# Patient Record
Sex: Male | Born: 1980 | Race: White | Hispanic: No | Marital: Married | Smoking: Former smoker
Health system: Southern US, Community
[De-identification: ages and names within clinical notes are randomized; demographics above are authoritative.]

## PROBLEM LIST (undated history)

## (undated) DIAGNOSIS — G500 Trigeminal neuralgia: Secondary | ICD-10-CM

## (undated) DIAGNOSIS — I10 Essential (primary) hypertension: Secondary | ICD-10-CM

## (undated) HISTORY — PX: TONSILLECTOMY: SUR1361

## (undated) HISTORY — PX: CRANIOTOMY: SHX93

---

## 2008-11-27 ENCOUNTER — Emergency Department (HOSPITAL_BASED_OUTPATIENT_CLINIC_OR_DEPARTMENT_OTHER): Admission: EM | Admit: 2008-11-27 | Discharge: 2008-11-27 | Payer: Self-pay | Admitting: Emergency Medicine

## 2010-04-07 LAB — BASIC METABOLIC PANEL
BUN: 11 mg/dL (ref 6–23)
CO2: 23 mEq/L (ref 19–32)
Calcium: 9.7 mg/dL (ref 8.4–10.5)
Chloride: 105 mEq/L (ref 96–112)
GFR calc non Af Amer: >60 mL/min (ref >60)
Glucose, Bld: 79 mg/dL (ref 70–99)

## 2010-04-07 LAB — DIFFERENTIAL
Basophils Absolute: 0.4 10*3/uL — ABNORMAL HIGH (ref 0.0–0.1)
Eosinophils Relative: 0 % (ref 0–5)
Lymphocytes Relative: 9 % — ABNORMAL LOW (ref 12–46)
Lymphs Abs: 1.6 10*3/uL (ref 0.7–4.0)
Monocytes Absolute: 0.8 10*3/uL (ref 0.1–1.0)
Neutro Abs: 13.9 10*3/uL — ABNORMAL HIGH (ref 1.7–7.7)

## 2010-04-07 LAB — URINALYSIS, ROUTINE W REFLEX MICROSCOPIC
Bilirubin Urine: NEGATIVE
Glucose, UA: NEGATIVE mg/dL
Ketones, ur: NEGATIVE mg/dL
Nitrite: NEGATIVE
Specific Gravity, Urine: 1.015 (ref 1.005–1.030)
pH: 6 (ref 5.0–8.0)

## 2010-04-07 LAB — CBC
HCT: 47.7 % (ref 39.0–52.0)
Hemoglobin: 16.3 g/dL (ref 13.0–17.0)
RBC: 5.49 MIL/uL (ref 4.22–5.81)
WBC: 16.7 10*3/uL — ABNORMAL HIGH (ref 4.0–10.5)

## 2014-10-30 ENCOUNTER — Other Ambulatory Visit: Payer: Self-pay | Admitting: Family Medicine

## 2014-10-30 ENCOUNTER — Ambulatory Visit
Admission: RE | Admit: 2014-10-30 | Discharge: 2014-10-30 | Disposition: A | Discharge status: Home / Self Care | Payer: 59 | Source: Ambulatory Visit | Attending: Family Medicine | Admitting: Family Medicine

## 2014-10-30 DIAGNOSIS — R05 Cough: Secondary | ICD-10-CM | POA: Diagnosis present

## 2014-10-30 DIAGNOSIS — R918 Other nonspecific abnormal finding of lung field: Secondary | ICD-10-CM | POA: Insufficient documentation

## 2014-10-30 DIAGNOSIS — R059 Cough, unspecified: Secondary | ICD-10-CM

## 2016-06-17 ENCOUNTER — Ambulatory Visit
Admission: EM | Admit: 2016-06-17 | Discharge: 2016-06-17 | Disposition: A | Discharge status: Home / Self Care | Payer: 59 | Attending: Emergency Medicine | Admitting: Emergency Medicine

## 2016-06-17 ENCOUNTER — Encounter: Payer: Self-pay | Admitting: Emergency Medicine

## 2016-06-17 DIAGNOSIS — H65191 Other acute nonsuppurative otitis media, right ear: Secondary | ICD-10-CM

## 2016-06-17 MED ORDER — BENZONATATE 200 MG PO CAPS: ORAL_CAPSULE | ORAL | 0 refills | Status: DC

## 2016-06-17 MED ORDER — AMOXICILLIN-POT CLAVULANATE 875-125 MG PO TABS: 1.0000 | ORAL_TABLET | Freq: Two times a day (BID) | ORAL | 0 refills | Status: DC

## 2016-06-17 NOTE — ED Provider Notes (Signed)
CSN: [id number]     Arrival date & time 06/17/16  1203 History   First MD Initiated Contact with Patient 06/17/16 1338     Chief Complaint  Patient presents with  . Sinus Problem   (Consider location/radiation/quality/duration/timing/severity/associated sxs/prior Treatment) HPI  36 year old male who presents with sinus congestion and pressure along with a cough that he had since Monday. [place] and upon return HIS FAMILY BECAME SICK 1 AND A TIME ON A DAILY BASIS. HE STATES THEY HAVE ALL IMPROVED BUT HE HAS REMAINED SICK. He has had some pressure in his ears. Is there any fever or chills. He has felt feverish however.       History reviewed. No pertinent past medical history. Past Surgical History:  Procedure Laterality Date  . CRANIOTOMY    . TONSILLECTOMY     Family History  Problem Relation Age of Onset  . Hypertension Mother   . Kidney Stones Mother   . CAD Father   . COPD Father    Social History  Substance Use Topics  . Smoking status: Former Games developermoker  . Smokeless tobacco: Current User  . Alcohol use No    Review of Systems  Constitutional: Positive for activity change. Negative for chills, fatigue and fever.  HENT: Positive for congestion, ear pain, postnasal drip, rhinorrhea, sinus pain and sinus pressure.   Respiratory: Positive for cough and shortness of breath. Negative for wheezing and stridor.   All other systems reviewed and are negative.   Allergies  Sudafed [pseudoephedrine]  Home Medications   Prior to Admission medications   Medication Sig Start Date End Date Taking? Authorizing Provider  amoxicillin-clavulanate (AUGMENTIN) 875-125 MG tablet Take 1 tablet by mouth every 12 (twelve) hours. 06/17/16   Lutricia Feiloemer, William P, PA-C  benzonatate (TESSALON) 200 MG capsule Take one cap TID PRN cough 06/17/16   Lutricia Feiloemer, William P, PA-C   Meds Ordered and Administered this Visit  Medications - No data to display  BP 125/80 (BP  Location: Left Arm)   Pulse 84   Temp 98 F (36.7 C) (Oral)   Resp 16   Ht 6\' 1"  (1.854 m)   Wt 248 lb (112.5 kg)   SpO2 100%   BMI 32.72 kg/m  No data found.   Physical Exam  Constitutional: He is oriented to person, place, and time. He appears well-developed and well-nourished. No distress.  HENT:  Head: Normocephalic and atraumatic.  Left Ear: External ear normal.  Nose: Nose normal.  Mouth/Throat: Oropharynx is clear and moist. No oropharyngeal exudate.  Examination of the right ear shows the right TM to be erythematous loss of the light reflex and a mildly bulging.  Eyes: Pupils are equal, round, and reactive to light. Right eye exhibits no discharge. Left eye exhibits no discharge.  Neck: Normal range of motion.  Pulmonary/Chest: Effort normal and breath sounds normal. No respiratory distress. He has no wheezes. He has no rales.  Musculoskeletal: Normal range of motion.  Lymphadenopathy:    He has no cervical adenopathy.  Neurological: He is alert and oriented to person, place, and time.  Skin: Skin is warm and dry. He is not diaphoretic.  Nursing note and vitals reviewed.   Urgent Care Course     Procedures (including critical care time)  Labs Review Labs Reviewed - No data to display  Imaging Review No results found.   Visual Acuity Review  Right Eye Distance:   Left Eye Distance:   Bilateral Distance:  Right Eye Near:   Left Eye Near:    Bilateral Near:         MDM   1. Other acute nonsuppurative otitis media of right ear, recurrence not specified    Discharge Medication List as of 06/17/2016  1:53 PM    START taking these medications   Details  amoxicillin-clavulanate (AUGMENTIN) 875-125 MG tablet Take 1 tablet by mouth every 12 (twelve) hours., Starting Fri 06/17/2016, Normal    benzonatate (TESSALON) 200 MG capsule Take one cap TID PRN cough, Normal      Plan: 1. Test/x-ray results and diagnosis reviewed with patient 2. rx as per  orders; risks, benefits, potential side effects reviewed with patient 3. Recommend supportive treatment with Use of Motrin for pain or feverish feeling. If not improving within 3-4 days should follow-up with primary care physician. 4. F/u prn if symptoms worsen or don't improve     Lutricia Feil, PA-C 06/17/16 1502

## 2016-06-17 NOTE — ED Triage Notes (Signed)
Patient c/o sinus congestion and pressure and cough since Tuesday.

## 2016-12-25 ENCOUNTER — Other Ambulatory Visit: Payer: Self-pay

## 2016-12-25 ENCOUNTER — Ambulatory Visit
Admission: EM | Admit: 2016-12-25 | Discharge: 2016-12-25 | Disposition: A | Discharge status: Home / Self Care | Payer: 59 | Attending: Family Medicine | Admitting: Family Medicine

## 2016-12-25 ENCOUNTER — Encounter: Payer: Self-pay | Admitting: Emergency Medicine

## 2016-12-25 DIAGNOSIS — J069 Acute upper respiratory infection, unspecified: Secondary | ICD-10-CM | POA: Diagnosis not present

## 2016-12-25 DIAGNOSIS — J029 Acute pharyngitis, unspecified: Secondary | ICD-10-CM

## 2016-12-25 NOTE — ED Provider Notes (Signed)
[facility]    CSN: [id number] Arrival date & time: 12/25/16  1542  History   Chief Complaint Chief Complaint  Patient presents with  . Nasal Congestion   HPI 36 year old male presents with ear pain, nasal congestion.  Patient reports a one-week history of congestion and ear pain, right greater than left.  He reports postnasal drip and sore throat as well.  He has been taking Sudafed without relief.  Symptoms are mild to moderate.  He has had a recent sick contact in his daughter.  No known exacerbating factors.  No other complaints at this time.  PMH -trigeminal neuralgia.  Past Surgical History:  Procedure Laterality Date  . CRANIOTOMY    . TONSILLECTOMY     Home Medications    Prior to Admission medications   Medication Sig Start Date End Date Taking? Authorizing Provider  Creatinine POWD by Does not apply route daily.   Yes [provider]  magnesium gluconate (MAGONATE) 500 MG tablet Take 500 mg by mouth daily.   Yes [provider]  Potassium 99 MG TABS Take 2 tablets by mouth daily.   Yes [provider]    Family History Family History  Problem Relation Age of Onset  . Hypertension Mother   . Kidney Stones Mother   . CAD Father   . COPD Father     Social History Social History   Tobacco Use  . Smoking status: Former Smoker    Last attempt to quit: 12/25/2008    Years since quitting: 8.0  . Smokeless tobacco: Current User  Substance Use Topics  . Alcohol use: No  . Drug use: No     Allergies   Sudafed [pseudoephedrine]   Review of Systems Review of Systems  Constitutional: Negative for chills and fever.  HENT: Positive for congestion, ear pain, postnasal drip and sore throat.    Physical Exam Triage Vital Signs ED Triage Vitals [12/25/16 1623]  Enc Vitals Group     BP 127/80     Pulse Rate 70     Resp 16     Temp 98.4 F (36.9 C)     Temp Source Oral     SpO2 99 %     Weight 250 lb (113.4 kg)     Height 6\' 1"  (1.854 m)     Head Circumference      Peak Flow      Pain Score 5     Pain Loc      Pain Edu?      Excl. in GC?    Updated Vital Signs BP 127/80 (BP Location: Left Arm)   Pulse 70   Temp 98.4 F (36.9 C) (Oral)   Resp 16   Ht 6\' 1"  (1.854 m)   Wt 250 lb (113.4 kg)   SpO2 99%   BMI 32.98 kg/m     Physical Exam  Constitutional: He is oriented to person, place, and time. He appears well-developed. No distress.  HENT:  Head: Normocephalic and atraumatic.  Oropharynx with moderate erythema.  No exudate.  Normal TMs bilaterally.  Eyes: Conjunctivae are normal.  Neck: Neck supple.  Cardiovascular: Normal rate and regular rhythm.  No murmur heard. Pulmonary/Chest: Effort normal and breath sounds normal. He has no wheezes. He has no rales.  Neurological: He is alert and oriented to person, place, and time.  Psychiatric: He has a normal mood and affect. His behavior is normal.  Vitals reviewed.  UC Treatments / Results  Labs (all labs ordered are listed, but only abnormal results are displayed) Labs Reviewed - No data to display  EKG  EKG Interpretation None       Radiology No results found.  Procedures Procedures (including critical care time)  Medications Ordered in UC Medications - No data to display   Initial Impression / Assessment and Plan / UC Course  I have reviewed the triage vital signs and the nursing notes.  Pertinent labs & imaging results that were available during my care of the patient were reviewed by me and considered in my medical decision making (see chart for details).     36 year old male presents with a viral respiratory infection.  Supportive care.  Over-the-counter Tylenol and Motrin as needed.  Final Clinical Impressions(s) / UC Diagnoses   Final diagnoses:  Viral upper respiratory tract infection   ED Discharge Orders    None     Controlled Substance Prescriptions [place] Controlled Substance Registry consulted? Not  Applicable   Tommie SamsCook, Amauria Younts G, DO 12/25/16 1810

## 2016-12-25 NOTE — ED Triage Notes (Signed)
Patient in today c/o 1 week history of nasal congestion and bilateral ear pain R>L. Patient denies fever. Patient has tried OTC Sudafed.

## 2017-02-11 ENCOUNTER — Ambulatory Visit
Admission: EM | Admit: 2017-02-11 | Discharge: 2017-02-11 | Disposition: A | Discharge status: Home / Self Care | Payer: BLUE CROSS/BLUE SHIELD | Attending: Family Medicine | Admitting: Family Medicine

## 2017-02-11 ENCOUNTER — Other Ambulatory Visit: Payer: Self-pay

## 2017-02-11 ENCOUNTER — Encounter: Payer: Self-pay | Admitting: Gynecology

## 2017-02-11 DIAGNOSIS — N451 Epididymitis: Secondary | ICD-10-CM

## 2017-02-11 DIAGNOSIS — N50819 Testicular pain, unspecified: Secondary | ICD-10-CM

## 2017-02-11 LAB — URINALYSIS, COMPLETE (UACMP) WITH MICROSCOPIC
BILIRUBIN URINE: NEGATIVE
Bacteria, UA: NONE SEEN
GLUCOSE, UA: NEGATIVE mg/dL
HGB URINE DIPSTICK: NEGATIVE
Ketones, ur: NEGATIVE mg/dL
Leukocytes, UA: NEGATIVE
NITRITE: NEGATIVE
Protein, ur: NEGATIVE mg/dL
RBC / HPF: NONE SEEN RBC/hpf (ref 0–5)
SPECIFIC GRAVITY, URINE: 1.01 (ref 1.005–1.030)
Squamous Epithelial / LPF: NONE SEEN
pH: 6.5 (ref 5.0–8.0)

## 2017-02-11 LAB — CHLAMYDIA/NGC RT PCR (ARMC ONLY)
Chlamydia Tr: NOT DETECTED
N gonorrhoeae: NOT DETECTED

## 2017-02-11 MED ORDER — HYDROCODONE-ACETAMINOPHEN 5-325 MG PO TABS: ORAL_TABLET | ORAL | 0 refills | Status: DC

## 2017-02-11 MED ORDER — DOXYCYCLINE HYCLATE 100 MG PO TABS: 100.0000 mg | ORAL_TABLET | Freq: Two times a day (BID) | ORAL | 0 refills | Status: DC

## 2017-02-11 NOTE — ED Provider Notes (Signed)
[facility]    CSN: [id number] Arrival date & time: 02/11/17  1339     History   Chief Complaint Chief Complaint  Patient presents with  . Groin Swelling    HPI Gregory Cunningham is a 37 y.o. male.   37 yo male with a c/o left testicle pain, frequent urination and brown tinged semen today during intercourse with his wife. Denies any trauma, fevers, chills, dysuria, penile discharge, skin redness, swelling.    The history is provided by the patient.    History reviewed. No pertinent past medical history.  There are no active problems to display for this patient.   Past Surgical History:  Procedure Laterality Date  . CRANIOTOMY    . TONSILLECTOMY         Home Medications    Prior to Admission medications   Medication Sig Start Date End Date Taking? Authorizing Provider  Creatinine POWD by Does not apply route daily.    [provider]  doxycycline (VIBRA-TABS) 100 MG tablet Take 1 tablet (100 mg total) by mouth 2 (two) times daily. 02/11/17   Payton Mccallum, MD  HYDROcodone-acetaminophen (NORCO/VICODIN) 5-325 MG tablet 1-2 tabs po bid prn 02/11/17   Payton Mccallum, MD  magnesium gluconate (MAGONATE) 500 MG tablet Take 500 mg by mouth daily.    [provider]  Potassium 99 MG TABS Take 2 tablets by mouth daily.    [provider]    Family History Family History  Problem Relation Age of Onset  . Hypertension Mother   . Kidney Stones Mother   . CAD Father   . COPD Father     Social History Social History   Tobacco Use  . Smoking status: Former Smoker    Last attempt to quit: 12/25/2008    Years since quitting: 8.1  . Smokeless tobacco: Current User  Substance Use Topics  . Alcohol use: No  . Drug use: No     Allergies   Sudafed [pseudoephedrine]   Review of Systems Review of Systems   Physical Exam Triage Vital Signs ED Triage Vitals  Enc Vitals Group     BP 02/11/17 1400 121/74     Pulse Rate 02/11/17  1400 71     Resp 02/11/17 1400 16     Temp 02/11/17 1400 97.8 F (36.6 C)     Temp Source 02/11/17 1400 Oral     SpO2 02/11/17 1400 100 %     Weight 02/11/17 1358 239 lb (108.4 kg)     Height --      Head Circumference --      Peak Flow --      Pain Score 02/11/17 1358 8     Pain Loc --      Pain Edu? --      Excl. in GC? --    No data found.  Updated Vital Signs BP 121/74 (BP Location: Left Arm)   Pulse 71   Temp 97.8 F (36.6 C) (Oral)   Resp 16   Wt 239 lb (108.4 kg)   SpO2 100%   BMI 31.53 kg/m   Visual Acuity Right Eye Distance:   Left Eye Distance:   Bilateral Distance:    Right Eye Near:   Left Eye Near:    Bilateral Near:     Physical Exam  Constitutional: He appears well-developed and well-nourished. No distress.  HENT:  Head: Normocephalic and atraumatic.  Abdominal: Hernia confirmed negative in the right inguinal area and confirmed  negative in the left inguinal area.  Genitourinary: Penis normal. Cremasteric reflex is present. Right testis shows no mass, no swelling and no tenderness. Right testis is descended. Cremasteric reflex is not absent on the right side. Left testis shows tenderness (over the epididymis). Left testis shows no mass and no swelling. Left testis is descended. Cremasteric reflex is not absent on the left side. No phimosis, paraphimosis, hypospadias, penile erythema or penile tenderness. No discharge found.  Lymphadenopathy: No inguinal adenopathy noted on the right or left side.  Skin: No rash noted. He is not diaphoretic.  Nursing note and vitals reviewed.    UC Treatments / Results  Labs (all labs ordered are listed, but only abnormal results are displayed) Labs Reviewed  URINALYSIS, COMPLETE (UACMP) WITH MICROSCOPIC - Abnormal; Notable for the following components:      Result Value   Color, Urine STRAW (*)    All other components within normal limits  CHLAMYDIA/NGC RT PCR [facility] ONLY)    EKG  EKG  Interpretation None       Radiology No results found.  Procedures Procedures (including critical care time)  Medications Ordered in UC Medications - No data to display   Initial Impression / Assessment and Plan / UC Course  I have reviewed the triage vital signs and the nursing notes.  Pertinent labs & imaging results that were available during my care of the patient were reviewed by me and considered in my medical decision making (see chart for details).       Final Clinical Impressions(s) / UC Diagnoses   Final diagnoses:  Epididymitis    ED Discharge Orders        Ordered    doxycycline (VIBRA-TABS) 100 MG tablet  2 times daily     02/11/17 1455    HYDROcodone-acetaminophen (NORCO/VICODIN) 5-325 MG tablet     02/11/17 1456     1. Lab results and diagnosis reviewed with patient 2. rx as per orders above; reviewed possible side effects, interactions, risks and benefits  3. Follow-up prn if symptoms worsen or don't improve  Controlled Substance Prescriptions [place] Controlled Substance Registry consulted? Not Applicable   Payton Mccallumonty, Ansleigh Safer, MD 02/11/17 [phone]

## 2017-02-11 NOTE — ED Triage Notes (Signed)
Patient c/o x yesterday left testicular pain/ frequent urination and brow discharge after ejaculation.

## 2017-05-23 IMAGING — CR DG CHEST 2V
2 series · 2 of 2 positions shown · non-contrast
Comparison: None in PACs

CLINICAL DATA: 3-4 weeks of productive cough associated with
anterior mid upper chest pain and dyspnea on exertion, history of
bronchitis

EXAM:
CHEST  2 VIEW

[chest pa]
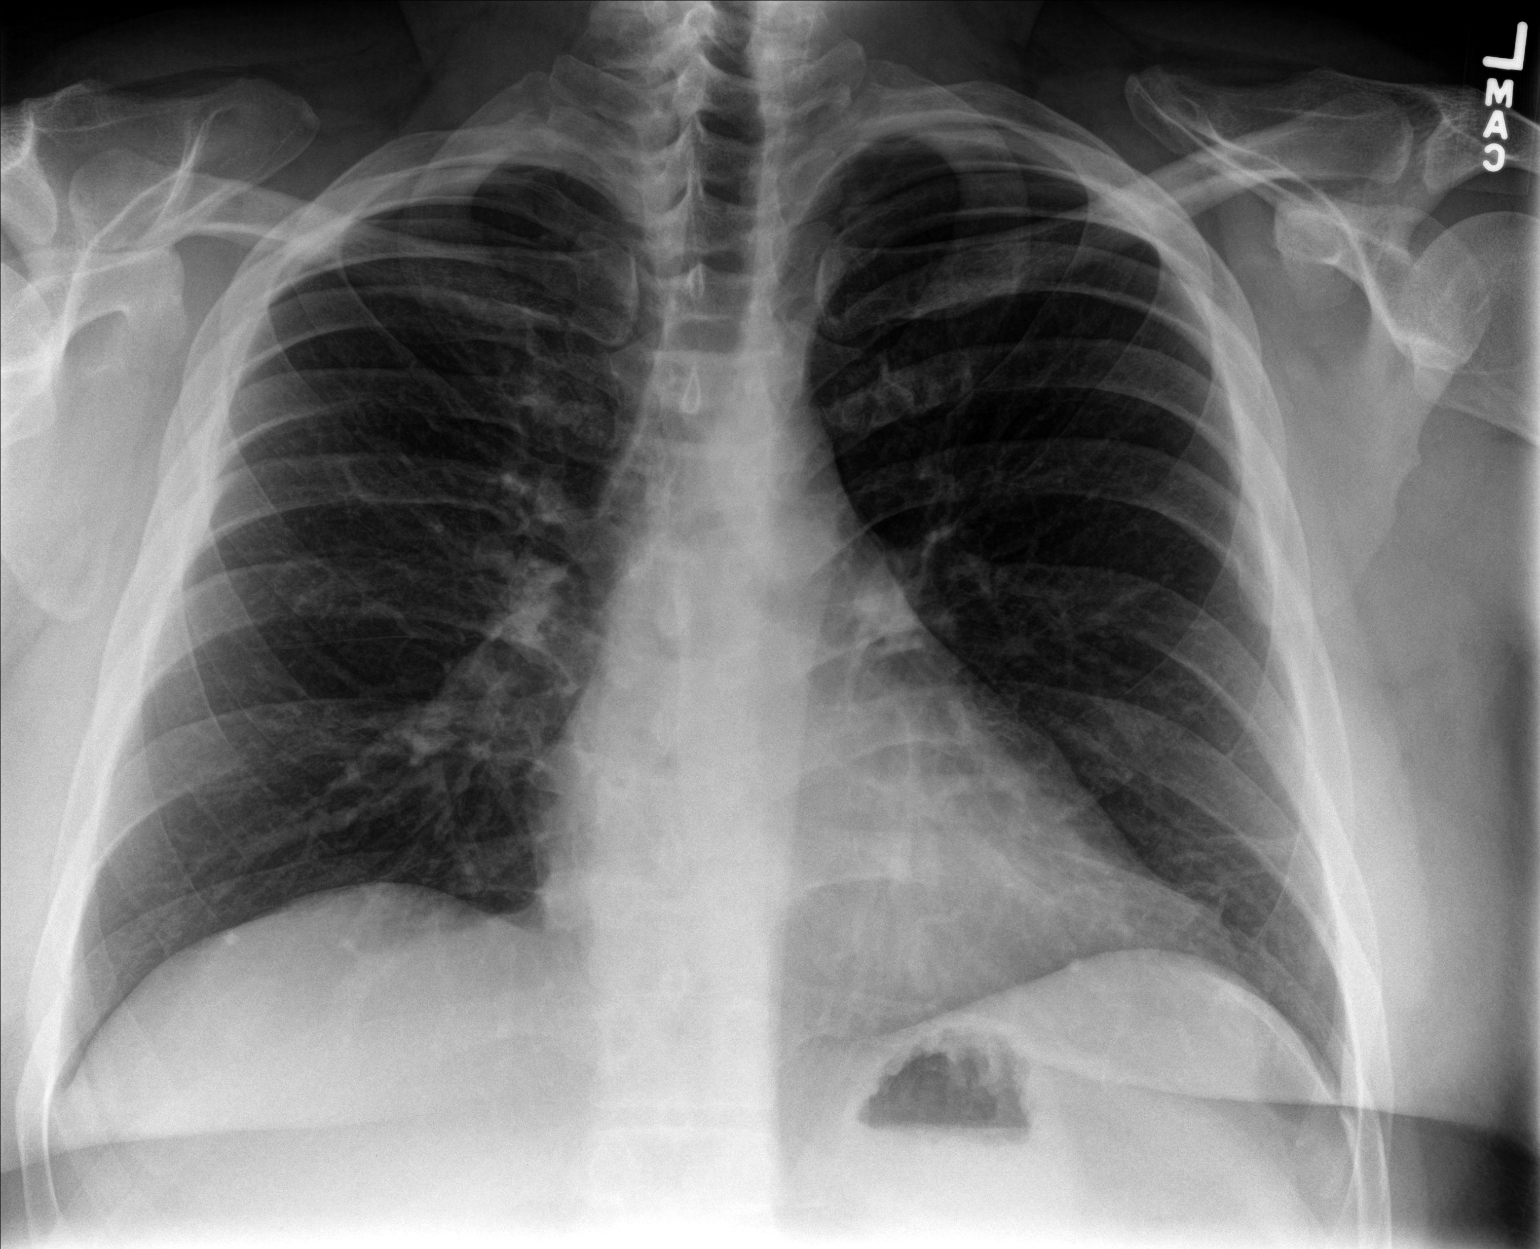

[chest lat]
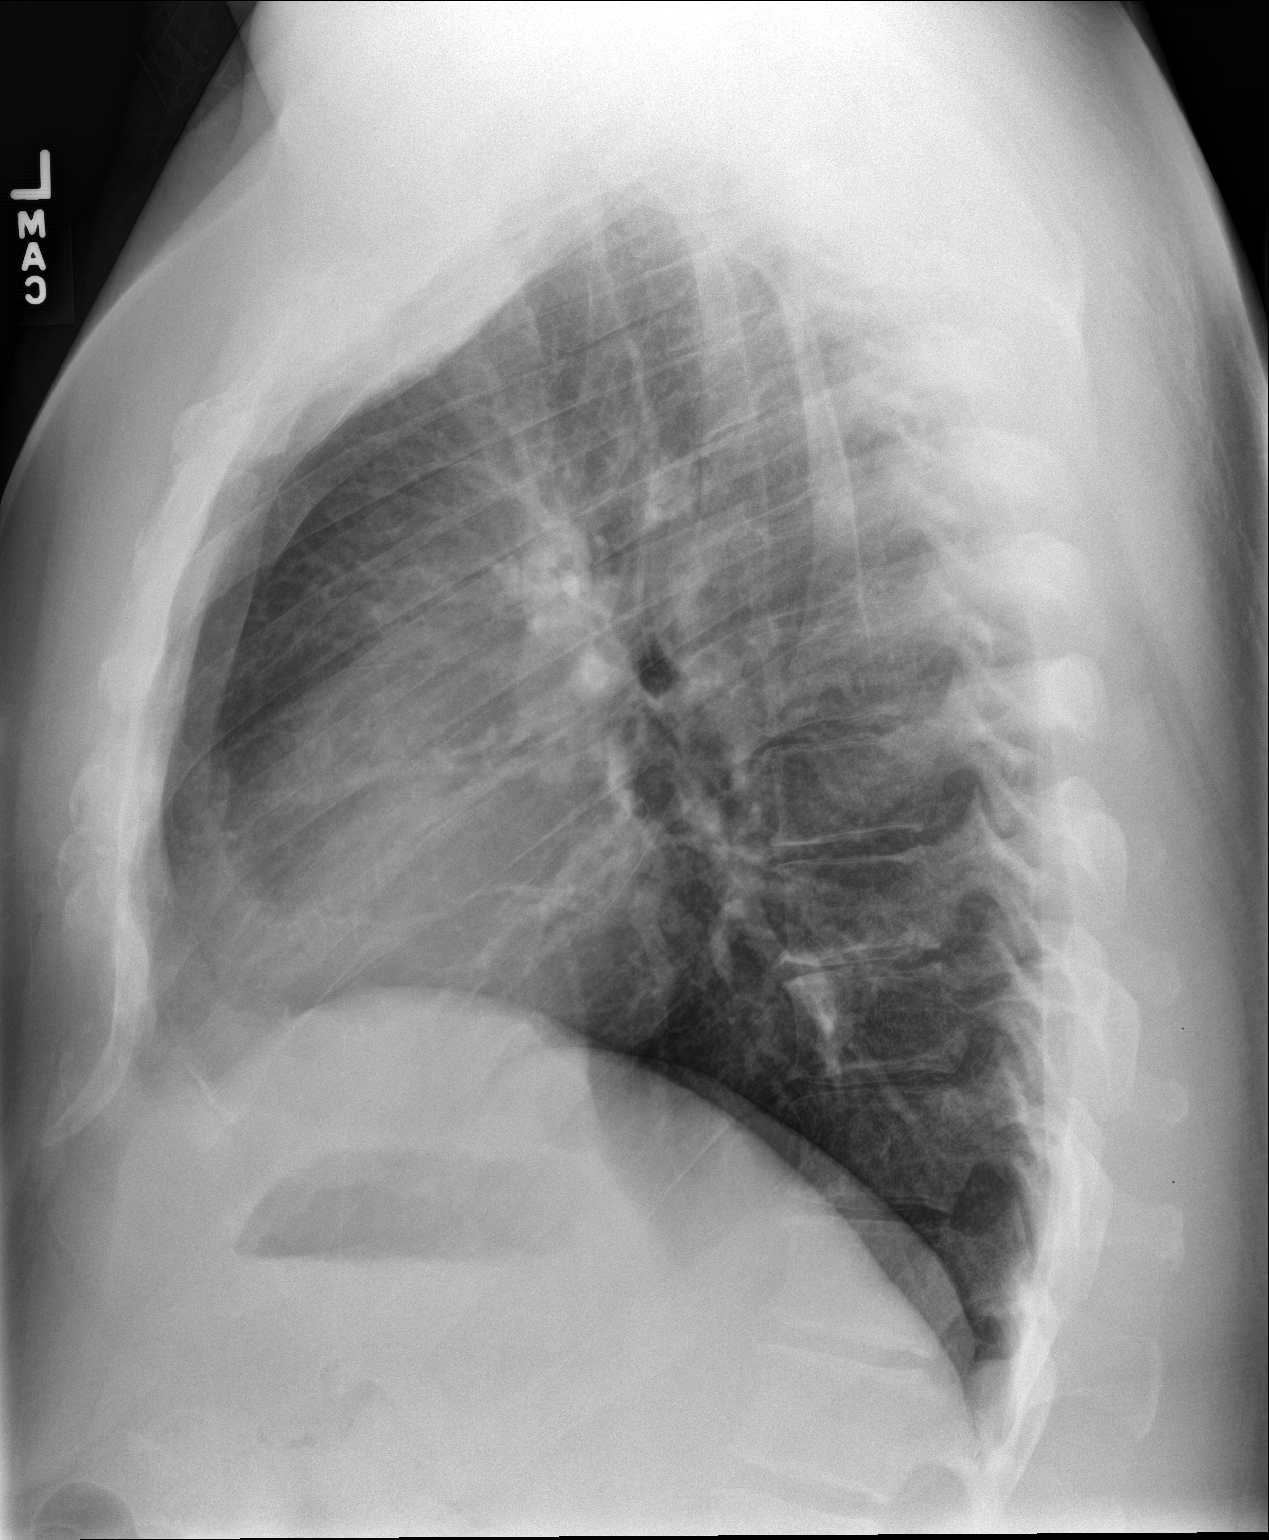

[2 of 2 positions shown; findings below may reference images not displayed]

FINDINGS: The lungs are adequately inflated. There is no focal infiltrate. The
interstitial markings are minimally prominent bilaterally. The heart
and pulmonary vascularity are normal. There is no pleural effusion.
IMPRESSION: Mild interstitial prominence may reflect acute or chronic bronchitic
changes. There is no alveolar pneumonia nor CHF. If the patient's
symptoms persist despite anticipated antibiotic therapy, chest CT
scanning may be useful.

## 2018-01-20 ENCOUNTER — Ambulatory Visit
Admission: EM | Admit: 2018-01-20 | Discharge: 2018-01-20 | Disposition: A | Discharge status: Home / Self Care | Payer: Managed Care, Other (non HMO) | Attending: Family Medicine | Admitting: Family Medicine

## 2018-01-20 ENCOUNTER — Other Ambulatory Visit: Payer: Self-pay

## 2018-01-20 ENCOUNTER — Encounter: Payer: Self-pay | Admitting: Gynecology

## 2018-01-20 DIAGNOSIS — H6501 Acute serous otitis media, right ear: Secondary | ICD-10-CM | POA: Diagnosis not present

## 2018-01-20 DIAGNOSIS — J029 Acute pharyngitis, unspecified: Secondary | ICD-10-CM

## 2018-01-20 DIAGNOSIS — R059 Cough, unspecified: Secondary | ICD-10-CM

## 2018-01-20 DIAGNOSIS — J111 Influenza due to unidentified influenza virus with other respiratory manifestations: Secondary | ICD-10-CM

## 2018-01-20 DIAGNOSIS — R0981 Nasal congestion: Secondary | ICD-10-CM

## 2018-01-20 DIAGNOSIS — R05 Cough: Secondary | ICD-10-CM | POA: Diagnosis not present

## 2018-01-20 DIAGNOSIS — H6591 Unspecified nonsuppurative otitis media, right ear: Secondary | ICD-10-CM | POA: Insufficient documentation

## 2018-01-20 DIAGNOSIS — R69 Illness, unspecified: Secondary | ICD-10-CM | POA: Insufficient documentation

## 2018-01-20 DIAGNOSIS — Z87891 Personal history of nicotine dependence: Secondary | ICD-10-CM

## 2018-01-20 LAB — RAPID STREP SCREEN (MED CTR MEBANE ONLY): Streptococcus, Group A Screen (Direct): NEGATIVE

## 2018-01-20 MED ORDER — AMOXICILLIN 875 MG PO TABS: 875.0000 mg | ORAL_TABLET | Freq: Two times a day (BID) | ORAL | 0 refills | Status: DC

## 2018-01-20 MED ORDER — HYDROCOD POLST-CPM POLST ER 10-8 MG/5ML PO SUER: 5.0000 mL | Freq: Every evening | ORAL | 0 refills | Status: DC | PRN

## 2018-01-20 NOTE — ED Triage Notes (Signed)
Patient c/o nasal drip / sore throat and fever of 101 / ear pain x 1 week

## 2018-01-20 NOTE — ED Provider Notes (Addendum)
[facility] ____________________________________________  Time seen: Approximately 11:32 AM  I have reviewed the triage vital signs and the nursing notes.   HISTORY  Chief Complaint No chief complaint on file.   HPI Gregory Cunningham is a 38 y.o. male presenting for evaluation of 4 days of runny nose, nasal congestion, sore throat with intermittent fevers.  States on Thursday he had a fever onset with sore throat and nasal congestion.  States sore throat currently is mild but previously was moderate.  States T-max 101.  Has overall continued to eat and drink well.  States does have still some chills.  Has been taken over-the-counter DayQuil, NyQuil, Tylenol, ibuprofen and Zicam which helps with symptoms.  States cough has intermittently disrupted sleep.  States that he did have a mild runny nose prior to the symptoms with some intermittent diarrhea.  And reports his kids recently sick with those similar complaints.  Continues eat and drink well.  Occasional still diarrhea.  Denies abdominal pain, chest pain, shortness of breath.  Denies other aggravating alleviating factors.  Rolm Gala, MD: PCP   History reviewed. No pertinent past medical history. Denies   There are no active problems to display for this patient.   Past Surgical History:  Procedure Laterality Date  . CRANIOTOMY    . TONSILLECTOMY       No current facility-administered medications for this encounter.   Current Outpatient Medications:  .  amoxicillin (AMOXIL) 875 MG tablet, Take 1 tablet (875 mg total) by mouth 2 (two) times daily., Disp: 20 tablet, Rfl: 0 .  chlorpheniramine-HYDROcodone (TUSSIONEX PENNKINETIC ER) 10-8 MG/5ML SUER, Take 5 mLs by mouth at bedtime as needed. do not drive or operate machinery while taking as can cause drowsiness., Disp: 50 mL, Rfl: 0  Allergies Sudafed [pseudoephedrine]  Family History  Problem Relation Age of Onset  . Hypertension Mother   . Kidney Stones  Mother   . CAD Father   . COPD Father     Social History Social History   Tobacco Use  . Smoking status: Former Smoker    Last attempt to quit: 12/25/2008    Years since quitting: 9.0  . Smokeless tobacco: Current User  Substance Use Topics  . Alcohol use: No  . Drug use: No    Review of Systems Constitutional: positive fever. ENT: as above.  Cardiovascular: Denies chest pain. Respiratory: Denies shortness of breath. Gastrointestinal: No abdominal pain.  Intermittent diarrhea.  No vomiting. Genitourinary: Negative for dysuria. Musculoskeletal: Negative for back pain. Skin: Negative for rash.  ____________________________________________   PHYSICAL EXAM:  VITAL SIGNS: ED Triage Vitals  Enc Vitals Group     BP 01/20/18 1038 120/85     Pulse Rate 01/20/18 1038 89     Resp 01/20/18 1038 16     Temp 01/20/18 1038 98.4 F (36.9 C)     Temp Source 01/20/18 1038 Oral     SpO2 01/20/18 1038 99 %     Weight 01/20/18 1040 250 lb (113.4 kg)     Height 01/20/18 1040 6\' 1"  (1.854 m)     Head Circumference --      Peak Flow --      Pain Score 01/20/18 1040 2     Pain Loc --      Pain Edu? --      Excl. in GC? --     Constitutional: Alert and oriented. Well appearing and in no acute distress. Eyes: Conjunctivae are normal.  Head: Atraumatic. No sinus  tenderness to palpation. No swelling. No erythema.  Ears: Left: Nontender, normal canal, no erythema, normal TM.  Right: Nontender, normal canal, moderate erythema with effusion present.  Nose:Nasal congestion with clear rhinorrhea  Mouth/Throat: Mucous membranes are moist. Mild pharyngeal erythema. No tonsillar swelling or exudate.  Neck: No stridor.  No cervical spine tenderness to palpation. Hematological/Lymphatic/Immunilogical: No cervical lymphadenopathy. Cardiovascular: Normal rate, regular rhythm. Grossly normal heart sounds.  Good peripheral circulation. Respiratory: Normal respiratory effort.  No retractions. No  wheezes, rales or rhonchi. Good air movement.  Musculoskeletal: Ambulatory with steady gait.  Neurologic:  Normal speech and language. No gait instability. Skin:  Skin appears warm, dry and intact. No rash noted. Psychiatric: Mood and affect are normal. Speech and behavior are normal. ___________________________________________   LABS (all labs ordered are listed, but only abnormal results are displayed)  Labs Reviewed  RAPID STREP SCREEN ([facility] ONLY)  CULTURE, GROUP A STREP [facility])   ____________________________________________  PROCEDURES Procedures   INITIAL IMPRESSION / ASSESSMENT AND PLAN / ED COURSE  Pertinent labs & imaging results that were available during my care of the patient were reviewed by me and considered in my medical decision making (see chart for details).  Overall well-appearing patient.  No acute distress.  Suspect recent viral illness such as influenza.  Right otitis with effusion.  Will treat with oral amoxicillin.  Encourage continue over-the-counter supportive care for viral symptoms including over-the-counter cough and congestion medication.  Rx for PRN Tussionex given as needed at nighttime.  Work note given.Discussed indication, risks and benefits of medications with patient.  Discussed follow up with Primary care physician this week. Discussed follow up and return parameters including no resolution or any worsening concerns. Patient verbalized understanding and agreed to plan.   ____________________________________________   FINAL CLINICAL IMPRESSION(S) / ED DIAGNOSES  Final diagnoses:  Influenza-like illness  Right otitis media with effusion  Cough     ED Discharge Orders         Ordered    chlorpheniramine-HYDROcodone (TUSSIONEX PENNKINETIC ER) 10-8 MG/5ML SUER  At bedtime PRN     01/20/18 1127    amoxicillin (AMOXIL) 875 MG tablet  2 times daily     01/20/18 1127           Note: This dictation was prepared with Dragon  dictation along with smaller phrase technology. Any transcriptional errors that result from this process are unintentional.         Renford Dills, NP 01/20/18 1139    Renford Dills, NP 01/20/18 1149

## 2018-01-20 NOTE — Discharge Instructions (Signed)
Take medication as prescribed. Rest. Drink plenty of fluids. Continue over the  counter medication.  ° °Follow up with your primary care physician this week as needed. Return to Urgent care for new or worsening concerns.  ° °

## 2018-01-23 LAB — CULTURE, GROUP A STREP (THRC)

## 2019-12-23 ENCOUNTER — Ambulatory Visit: Payer: Managed Care, Other (non HMO) | Attending: Internal Medicine

## 2019-12-23 DIAGNOSIS — Z23 Encounter for immunization: Secondary | ICD-10-CM

## 2019-12-23 NOTE — Progress Notes (Signed)
° °  Covid-19 Vaccination Clinic  Name:  Idrissa Beville    MRN: [id number] DOB: [birth year]  12/23/2019  Mr. Bucklin was observed post Covid-19 immunization for 15 minutes without incident. He was provided with Vaccine Information Sheet and instruction to access the V-Safe system.   Mr. Resnik was instructed to call 911 with any severe reactions post vaccine:  Difficulty breathing   Swelling of face and throat   A fast heartbeat   A bad rash all over body   Dizziness and weakness   Immunizations Administered    Name Date Dose VIS Date Route   Pfizer COVID-19 Vaccine 12/23/2019  3:57 PM 0.3 mL 10/23/2019 Intramuscular   Manufacturer: [employer], [employer]   Lot: [id number]   NDC: [address]-4

## 2019-12-24 ENCOUNTER — Encounter: Payer: Self-pay | Admitting: Emergency Medicine

## 2019-12-24 ENCOUNTER — Ambulatory Visit
Admission: EM | Admit: 2019-12-24 | Discharge: 2019-12-24 | Disposition: A | Discharge status: Home / Self Care | Payer: Managed Care, Other (non HMO) | Attending: Family Medicine | Admitting: Family Medicine

## 2019-12-24 ENCOUNTER — Other Ambulatory Visit: Payer: Self-pay

## 2019-12-24 DIAGNOSIS — G518 Other disorders of facial nerve: Secondary | ICD-10-CM

## 2019-12-24 DIAGNOSIS — T50B95A Adverse effect of other viral vaccines, initial encounter: Secondary | ICD-10-CM

## 2019-12-24 HISTORY — DX: Trigeminal neuralgia: G50.0

## 2019-12-24 MED ORDER — PREDNISONE 10 MG (21) PO TBPK: ORAL_TABLET | ORAL | 0 refills | Status: DC

## 2019-12-24 NOTE — Discharge Instructions (Signed)
We will try a steroid taper for symptoms If worse or no improvement see your primary care doctor.

## 2019-12-24 NOTE — ED Provider Notes (Signed)
Gregory Cunningham    CSN: [id number] Arrival date & time: 12/24/19  1157      History   Chief Complaint Chief Complaint  Patient presents with   Numbness    HPI Gregory Cunningham is a 39 y.o. male.   Patient is a 39-year male with past medical history of trigeminal neuralgia.  Reporting left-sided facial pain, jaw numbness since yesterday.  Symptoms consistent with previous trigeminal neuralgia.  He had to have surgical intervention for this many years ago.  He received his first COVID vaccine yesterday prior to symptoms starting.  Patient a little anxious today.  Denies any chest pain, shortness of breath.     Past Medical History:  Diagnosis Date   Trigeminal neuralgia     There are no problems to display for this patient.   Past Surgical History:  Procedure Laterality Date   CRANIOTOMY     TONSILLECTOMY         Home Medications    Prior to Admission medications   Medication Sig Start Date End Date Taking? Authorizing Provider  predniSONE (STERAPRED UNI-PAK 21 TAB) 10 MG (21) TBPK tablet 6 tabs for 1 day, then 5 tabs for 1 das, then 4 tabs for 1 day, then 3 tabs for 1 day, 2 tabs for 1 day, then 1 tab for 1 day 12/24/19   Janace Aris, NP    Family History Family History  Problem Relation Age of Onset   Hypertension Mother    Kidney Stones Mother    CAD Father    COPD Father     Social History Social History   Tobacco Use   Smoking status: Former Smoker    Quit date: 12/25/2008    Years since quitting: 11.0   Smokeless tobacco: Current User  Vaping Use   Vaping Use: Never used  Substance Use Topics   Alcohol use: No   Drug use: No     Allergies   Sudafed [pseudoephedrine]   Review of Systems Review of Systems   Physical Exam Triage Vital Signs ED Triage Vitals  Enc Vitals Group     BP 12/24/19 1215 (!) 163/100     Pulse Rate 12/24/19 1215 78     Resp 12/24/19 1215 19     Temp 12/24/19 1215 98.6 F (37 C)      Temp Source 12/24/19 1215 Oral     SpO2 12/24/19 1215 98 %     Weight 12/24/19 1213 (!) 310 lb (140.6 kg)     Height 12/24/19 1213 6\' 1"  (1.854 m)     Head Circumference --      Peak Flow --      Pain Score 12/24/19 1213 0     Pain Loc --      Pain Edu? --      Excl. in GC? --    No data found.  Updated Vital Signs BP (!) 163/100 (BP Location: Right Arm)    Pulse 78    Temp 98.6 F (37 C) (Oral)    Resp 19    Ht 6\' 1"  (1.854 m)    Wt (!) 310 lb (140.6 kg)    SpO2 98%    BMI 40.90 kg/m   Visual Acuity Right Eye Distance:   Left Eye Distance:   Bilateral Distance:    Right Eye Near:   Left Eye Near:    Bilateral Near:     Physical Exam Vitals and nursing note reviewed.  Constitutional:  Appearance: Normal appearance.  HENT:     Head: Normocephalic and atraumatic.  Eyes:     Conjunctiva/sclera: Conjunctivae normal.  Pulmonary:     Effort: Pulmonary effort is normal.  Musculoskeletal:        General: Normal range of motion.     Cervical back: Normal range of motion.  Skin:    General: Skin is warm and dry.  Neurological:     General: No focal deficit present.     Mental Status: He is alert.     Cranial Nerves: Cranial nerve deficit present.     Sensory: No sensory deficit.     Motor: No weakness.     Coordination: Coordination normal.     Gait: Gait normal.     Deep Tendon Reflexes: Reflexes normal.  Psychiatric:        Mood and Affect: Mood normal.      UC Treatments / Results  Labs (all labs ordered are listed, but only abnormal results are displayed) Labs Reviewed - No data to display  EKG   Radiology No results found.  Procedures Procedures (including critical care time)  Medications Ordered in UC Medications - No data to display  Initial Impression / Assessment and Plan / UC Course  I have reviewed the triage vital signs and the nursing notes.  Pertinent labs & imaging results that were available during my care of the patient were  reviewed by me and considered in my medical decision making (see chart for details).     Facial nerve pain post covid vaccine No facial nerve palsy or concern for bells palsy Could be reoccurrence of the trigeminal neuralgia.  Most likely reaction to the vaccine.  Treating with prednisone.   BP rechecked here and decreased to 145/75  No current chest pain, SOB. No concern for ACS at this time.  For worsening symptoms recommend go to the ER. Pt agreed.   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     ED Prescriptions    Medication Sig Dispense Auth. Provider   predniSONE (STERAPRED UNI-PAK 21 TAB) 10 MG (21) TBPK tablet 6 tabs for 1 day, then 5 tabs for 1 das, then 4 tabs for 1 day, then 3 tabs for 1 day, 2 tabs for 1 day, then 1 tab for 1 day 21 tablet Mckinsley Koelzer A, NP     PDMP not reviewed this encounter.   Janace Aris, NP 12/24/19 1302

## 2019-12-24 NOTE — ED Triage Notes (Signed)
Patient c/o LFT sided facial/jaw numbness since yesterday.   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   Patient endorsed emesis upon onset of symptoms.   History trigeminal neuralgia.

## 2019-12-27 ENCOUNTER — Emergency Department
Admission: EM | Admit: 2019-12-27 | Discharge: 2019-12-27 | Disposition: A | Discharge status: Home / Self Care | Payer: Managed Care, Other (non HMO) | Attending: Emergency Medicine | Admitting: Emergency Medicine

## 2019-12-27 ENCOUNTER — Other Ambulatory Visit: Payer: Self-pay

## 2019-12-27 DIAGNOSIS — G500 Trigeminal neuralgia: Secondary | ICD-10-CM | POA: Diagnosis not present

## 2019-12-27 DIAGNOSIS — Z87891 Personal history of nicotine dependence: Secondary | ICD-10-CM | POA: Diagnosis not present

## 2019-12-27 MED ORDER — OXYCODONE-ACETAMINOPHEN 7.5-325 MG PO TABS: 1.0000 | ORAL_TABLET | Freq: Four times a day (QID) | ORAL | 0 refills | Status: DC | PRN

## 2019-12-27 MED ORDER — CYCLOBENZAPRINE HCL 10 MG PO TABS: 10.0000 mg | ORAL_TABLET | Freq: Three times a day (TID) | ORAL | 0 refills | Status: DC | PRN

## 2019-12-27 NOTE — ED Triage Notes (Signed)
Pt states he had his first covid shot on Monday and about 45 min later began having left jaw pain like when he had trigeminal neuralgia and had to have surgery for in the past. States he went to urgent care the next day and was placed on prednisone dose pack but is not having any relief from the pain.

## 2019-12-27 NOTE — ED Notes (Signed)
Pt to ED via POV with c/o having 1st Covid shot on Monday, states hx of trigeminal neuralgia that was fixed with surgery in 2015, states after covid vaccine began having trigeminal neuralgia flare up, was seen and prescribed prednisone at [facility] on Tuesday, states pain is no better since being seen at [facility].

## 2019-12-27 NOTE — ED Notes (Signed)
NAD noted at time of D/C. Pt states understanding regarding importance of follow up regarding increased BP at this time. PA aware of continued hypertension at this time. Verbal consent for D/C obtained by this RN. Pt ambulatory to the lobby at this time.

## 2019-12-27 NOTE — Discharge Instructions (Addendum)
Continue previous medication.  Start Flexeril and Percocet.  If condition persists call previous neurologist to schedule an appointment.  Recommend 3-day blood pressure check secondary to elevated readings today of 156/105.

## 2019-12-27 NOTE — ED Provider Notes (Signed)
[facility] Emergency Department Provider Note   ____________________________________________   Event Date/Time   First MD Initiated Contact with Patient 12/27/19 [phone]     (approximate)  I have reviewed the triage vital signs and the nursing notes.   HISTORY  Chief Complaint Medication Reaction and Facial Pain    HPI [name] is a 39 y.o. male patient presents with left trigeminal neuralgia.  Patient has a history of this complaint which was surgically corrected 6 years ago.  Patient states 4 days ago he took his first Pfizer COVID-19 vaccine patient state approximate 45 minutes later he start experiencing left-sided facial pain.  Patient went to urgent care clinic 2 days ago and was placed on a Medrol Dosepak.  Patient states no change,has been a slight increase in pain.  Rates pain 6/10.         Past Medical History:  Diagnosis Date  . Trigeminal neuralgia     There are no problems to display for this patient.   Past Surgical History:  Procedure Laterality Date  . CRANIOTOMY    . TONSILLECTOMY      Prior to Admission medications   Medication Sig Start Date End Date Taking? Authorizing Provider  cyclobenzaprine (FLEXERIL) 10 MG tablet Take 1 tablet (10 mg total) by mouth 3 (three) times daily as needed. 12/27/19   Joni Reining, PA-C  oxyCODONE-acetaminophen (PERCOCET) 7.5-325 MG tablet Take 1 tablet by mouth every 6 (six) hours as needed for severe pain. 12/27/19   Joni Reining, PA-C  predniSONE (STERAPRED UNI-PAK 21 TAB) 10 MG (21) TBPK tablet 6 tabs for 1 day, then 5 tabs for 1 das, then 4 tabs for 1 day, then 3 tabs for 1 day, 2 tabs for 1 day, then 1 tab for 1 day 12/24/19   Janace Aris, NP    Allergies Sudafed [pseudoephedrine]  Family History  Problem Relation Age of Onset  . Hypertension Mother   . Kidney Stones Mother   . CAD Father   . COPD Father     Social History Social History   Tobacco Use  . Smoking  status: Former Smoker    Quit date: 12/25/2008    Years since quitting: 11.0  . Smokeless tobacco: Current User  Vaping Use  . Vaping Use: Never used  Substance Use Topics  . Alcohol use: No  . Drug use: No    Review of Systems Constitutional: No fever/chills Eyes: No visual changes. ENT: No sore throat. Cardiovascular: Denies chest pain. Respiratory: Denies shortness of breath. Gastrointestinal: No abdominal pain.  No nausea, no vomiting.  No diarrhea.  No constipation. Genitourinary: Negative for dysuria. Musculoskeletal: Negative for back pain. Skin: Negative for rash. Neurological: Negative for headaches, focal weakness or numbness.  Left trigeminal neruralgia.   Allergic/Immunilogical: Sulfa and Sudafed. ____________________________________________   PHYSICAL EXAM:  VITAL SIGNS: ED Triage Vitals [12/27/19 0724]  Enc Vitals Group     BP (!) 156/105     Pulse Rate 80     Resp 17     Temp 98.5 F (36.9 C)     Temp Source Oral     SpO2 100 %     Weight (!) 305 lb (138.3 kg)     Height 6\' 1"  (1.854 m)     Head Circumference      Peak Flow      Pain Score 6     Pain Loc      Pain Edu?  Excl. in GC?     Constitutional: Alert and oriented. Well appearing and in no acute distress. Eyes: Conjunctivae are normal. PERRL. EOMI. Head: Atraumatic. Nose: No congestion/rhinnorhea. Mouth/Throat: Mucous membranes are moist.  Oropharynx non-erythematous. Neck: No stridor.  No cervical spine tenderness to palpation. Hematological/Lymphatic/Immunilogical: No cervical lymphadenopathy. Cardiovascular: Normal rate, regular rhythm. Grossly normal heart sounds.  Good peripheral circulation.  Elevated blood pressure. Respiratory: Normal respiratory effort.  No retractions. Lungs CTAB. Gastrointestinal: Soft and nontender. No distention. No abdominal bruits. No CVA tenderness. Neurologic:  Normal speech and language. No gross focal neurologic deficits are appreciated. No gait  instability. Skin:  Skin is warm, dry and intact. No rash noted. Psychiatric: Mood and affect are normal. Speech and behavior are normal.  ____________________________________________   LABS (all labs ordered are listed, but only abnormal results are displayed)  Labs Reviewed - No data to display ____________________________________________  EKG   ____________________________________________  RADIOLOGY I, Joni Reining, personally viewed and evaluated these images (plain radiographs) as part of my medical decision making, as well as reviewing the written report by the radiologist.  ED MD interpretation:    Official radiology report(s): No results found.  ____________________________________________   PROCEDURES  Procedure(s) performed (including Critical Care):  Procedures   ____________________________________________   INITIAL IMPRESSION / ASSESSMENT AND PLAN / ED COURSE  As part of my medical decision making, I reviewed the following data within the electronic MEDICAL RECORD NUMBER         Patient presents with left facial pain.  Physical exam is grossly unremarkable except for complaint.  Advised patient to finish tapering steroid.  Prescription for Flexeril and Percocet given today.  Advised patient if no improvement in 3 days to contact previous neurologist and schedule appointment for reevaluation.  Advised to see neurologist before receiving his second Covid vaccine.     ____________________________________________   FINAL CLINICAL IMPRESSION(S) / ED DIAGNOSES  Final diagnoses:  Trigeminal neuralgia of left side of face     ED Discharge Orders         Ordered    cyclobenzaprine (FLEXERIL) 10 MG tablet  3 times daily PRN        12/27/19 0850    oxyCODONE-acetaminophen (PERCOCET) 7.5-325 MG tablet  Every 6 hours PRN        12/27/19 0850          *Please note:  Gregory Cunningham was evaluated in Emergency Department on 12/27/2019 for the symptoms  described in the history of present illness. He was evaluated in the context of the global COVID-19 pandemic, which necessitated consideration that the patient might be at risk for infection with the SARS-CoV-2 virus that causes COVID-19. Institutional protocols and algorithms that pertain to the evaluation of patients at risk for COVID-19 are in a state of rapid change based on information released by regulatory bodies including the CDC and federal and state organizations. These policies and algorithms were followed during the patient's care in the ED.  Some ED evaluations and interventions may be delayed as a result of limited staffing during and the pandemic.*   Note:  This document was prepared using Dragon voice recognition software and may include unintentional dictation errors.    Joni Reining, PA-C 12/27/19 3785    Sharyn Creamer, MD 12/27/19 [phone]

## 2020-01-20 ENCOUNTER — Ambulatory Visit: Payer: Managed Care, Other (non HMO)

## 2021-03-16 ENCOUNTER — Ambulatory Visit
Admission: RE | Admit: 2021-03-16 | Discharge: 2021-03-16 | Disposition: A | Discharge status: Home / Self Care | Payer: Managed Care, Other (non HMO) | Source: Ambulatory Visit | Attending: Physician Assistant | Admitting: Physician Assistant

## 2021-03-16 ENCOUNTER — Other Ambulatory Visit: Payer: Self-pay

## 2021-03-16 VITALS — BP 160/110 | HR 106 | Temp 99.1°F | Resp 18 | Ht 73.0 in | Wt 304.9 lb

## 2021-03-16 DIAGNOSIS — R509 Fever, unspecified: Secondary | ICD-10-CM | POA: Insufficient documentation

## 2021-03-16 DIAGNOSIS — U071 COVID-19: Secondary | ICD-10-CM | POA: Diagnosis present

## 2021-03-16 DIAGNOSIS — R051 Acute cough: Secondary | ICD-10-CM | POA: Insufficient documentation

## 2021-03-16 LAB — GROUP A STREP BY PCR: Group A Strep by PCR: NOT DETECTED

## 2021-03-16 LAB — RESP PANEL BY RT-PCR (FLU A&B, COVID) ARPGX2
Influenza A by PCR: NEGATIVE
Influenza B by PCR: NEGATIVE
SARS Coronavirus 2 by RT PCR: POSITIVE — AB

## 2021-03-16 MED ORDER — MOLNUPIRAVIR 200 MG PO CAPS: 4.0000 | ORAL_CAPSULE | Freq: Two times a day (BID) | ORAL | 0 refills | Status: AC

## 2021-03-16 NOTE — ED Triage Notes (Signed)
Pt c/o fever (102), cough, sweats, sore throat, chest tightness. Started yesterday. Home covid test negative.  ?

## 2021-03-16 NOTE — ED Provider Notes (Signed)
?[facility] ? ? ? ?CSN: [id number] ?Arrival date & time: 03/16/21  1159 ? ? ?  ? ?History   ?Chief Complaint ?Chief Complaint  ?Patient presents with  ? Appointment  ? Fever  ? Cough  ? ? ?HPI ?Gregory Cunningham is a 41 y.o. male presenting for fever up to 102 degrees, fatigue, body aches, cough, sweats, sore throat and chest tightness.  Symptom onset was yesterday.  He did do a home COVID test that was negative.  He denies any sick contacts.  States he did go to a gun show over the weekend and was in contact with over 100 people.  Reports that his family is not sick and they took COVID test as well which were negative.  He has been taking ibuprofen and Tylenol over-the-counter for fever control but no other OTC meds.  Medical history significant for obesity, trigeminal neuralgia, and hypertension. ? ?HPI ? ?Past Medical History:  ?Diagnosis Date  ? Trigeminal neuralgia   ? ? ?There are no problems to display for this patient. ? ? ?Past Surgical History:  ?Procedure Laterality Date  ? CRANIOTOMY    ? TONSILLECTOMY    ? ? ? ? ? ?Home Medications   ? ?Prior to Admission medications   ?Medication Sig Start Date End Date Taking? Authorizing Provider  ?hydrochlorothiazide (HYDRODIURIL) 25 MG tablet Take 25 mg by mouth daily. 03/02/21  Yes [provider]  ?molnupiravir EUA (LAGEVRIO) 200 MG CAPS capsule Take 4 capsules (800 mg total) by mouth 2 (two) times daily for 5 days. 03/16/21 03/21/21 Yes Shirlee LatchEaves, Khylon Davies B, PA-C  ?OXcarbazepine (TRILEPTAL) 150 MG tablet Patient takes 450 mg three times daily and 600 mg once daily 01/06/20  Yes [provider]  ? ? ?Family History ?Family History  ?Problem Relation Age of Onset  ? Hypertension Mother   ? Kidney Stones Mother   ? CAD Father   ? COPD Father   ? ? ?Social History ?Social History  ? ?Tobacco Use  ? Smoking status: Former  ?  Types: Cigarettes  ?  Quit date: 12/25/2008  ?  Years since quitting: 12.2  ? Smokeless tobacco: Current  ?Vaping Use  ?  Vaping Use: Never used  ?Substance Use Topics  ? Alcohol use: No  ? Drug use: No  ? ? ? ?Allergies   ?Sudafed [pseudoephedrine] ? ? ?Review of Systems ?Review of Systems  ?Constitutional:  Positive for diaphoresis, fatigue and fever.  ?HENT:  Positive for congestion, rhinorrhea and sore throat. Negative for sinus pressure and sinus pain.   ?Respiratory:  Positive for cough and chest tightness. Negative for shortness of breath.   ?Cardiovascular:  Negative for chest pain.  ?Gastrointestinal:  Negative for abdominal pain, diarrhea, nausea and vomiting.  ?Musculoskeletal:  Positive for myalgias.  ?Neurological:  Positive for headaches. Negative for weakness and light-headedness.  ?Hematological:  Negative for adenopathy.  ? ? ?Physical Exam ?Triage Vital Signs ?ED Triage Vitals  ?Enc Vitals Group  ?   BP 03/16/21 1215 (!) 160/110  ?   Pulse Rate 03/16/21 1215 (!) 106  ?   Resp 03/16/21 1215 18  ?   Temp 03/16/21 1215 99.1 ?F (37.3 ?C)  ?   Temp Source 03/16/21 1215 Oral  ?   SpO2 03/16/21 1215 98 %  ?   Weight 03/16/21 1212 (!) 304 lb 14.3 oz (138.3 kg)  ?   Height 03/16/21 1212 6\' 1"  (1.854 m)  ?   Head Circumference --   ?  Peak Flow --   ?   Pain Score 03/16/21 1212 4  ?   Pain Loc --   ?   Pain Edu? --   ?   Excl. in GC? --   ? ?No data found. ? ?Updated Vital Signs ?BP (!) 160/110 (BP Location: Right Arm)   Pulse (!) 106   Temp 99.1 ?F (37.3 ?C) (Oral)   Resp 18   Ht 6\' 1"  (1.854 m)   Wt (!) 304 lb 14.3 oz (138.3 kg)   SpO2 98%   BMI 40.23 kg/m?  ?  ? ?Physical Exam ?Vitals and nursing note reviewed.  ?Constitutional:   ?   General: He is not in acute distress. ?   Appearance: Normal appearance. He is well-developed. He is ill-appearing and diaphoretic.  ?HENT:  ?   Head: Normocephalic and atraumatic.  ?   Nose: Congestion present.  ?   Mouth/Throat:  ?   Mouth: Mucous membranes are moist.  ?   Pharynx: Oropharynx is clear. Posterior oropharyngeal erythema present.  ?Eyes:  ?   General: No scleral  icterus. ?   Conjunctiva/sclera: Conjunctivae normal.  ?Cardiovascular:  ?   Rate and Rhythm: Regular rhythm. Tachycardia present.  ?   Heart sounds: Normal heart sounds.  ?Pulmonary:  ?   Effort: Pulmonary effort is normal. No respiratory distress.  ?   Breath sounds: Normal breath sounds.  ?Musculoskeletal:  ?   Cervical back: Neck supple.  ?Skin: ?   General: Skin is warm.  ?   Capillary Refill: Capillary refill takes less than 2 seconds.  ?Neurological:  ?   General: No focal deficit present.  ?   Mental Status: He is alert. Mental status is at baseline.  ?   Motor: No weakness.  ?   Coordination: Coordination normal.  ?   Gait: Gait normal.  ?Psychiatric:     ?   Mood and Affect: Mood normal.     ?   Behavior: Behavior normal.     ?   Thought Content: Thought content normal.  ? ? ? ?UC Treatments / Results  ?Labs ?(all labs ordered are listed, but only abnormal results are displayed) ?Labs Reviewed  ?RESP PANEL BY RT-PCR (FLU A&B, COVID) ARPGX2 - Abnormal; Notable for the following components:  ?    Result Value  ? SARS Coronavirus 2 by RT PCR POSITIVE (*)   ? All other components within normal limits  ?GROUP A STREP BY PCR  ? ? ?EKG ? ? ?Radiology ?No results found. ? ?Procedures ?Procedures (including critical care time) ? ?Medications Ordered in UC ?Medications - No data to display ? ?Initial Impression / Assessment and Plan / UC Course  ?I have reviewed the triage vital signs and the nursing notes. ? ?Pertinent labs & imaging results that were available during my care of the patient were reviewed by me and considered in my medical decision making (see chart for details). ? ?41 year old male presenting for onset of fever, fatigue, cough, congestion, headaches, body aches and chest tightness yesterday.  BP elevated at 160/110.  Pulse elevated at 106.  Patient is mildly diaphoretic on his forehead and ill-appearing but nontoxic.  Exam significant for nasal congestion and posterior pharyngeal erythema.  Chest  is clear auscultation.  Heart regular rhythm. ? ?PCR strep test and respiratory panel obtained.  Reviewed current CDC guidelines, isolation protocol and ED precautions if COVID-positive. ? ?Patient's respiratory panel is positive for COVID-19.  Discussed results with him.  Patient does meet criteria for antiviral therapy.  Sent Molnupiravir to pharmacy.  Offered cough medication but he will take OTC meds as needed.  Reviewed continuing antipyretics for fever control, rest and fluids.  Offered work note but he says he does not need 1.  Reviewed return and ER precautions. ? ?Advised patient we will contact him if strep is positive and start him on antibiotics. ? ?Final Clinical Impressions(s) / UC Diagnoses  ? ?Final diagnoses:  ?COVID-19  ?Acute cough  ?Fever, unspecified  ? ? ? ?Discharge Instructions   ? ?  ?-Your COVID test was positive.  I sent the COVID antiviral medicine to the pharmacy.  As we discussed you need to isolate 5 days and wear a mask for 5 days.  Today is day 1. ?- Increase rest and fluids.  You can take over-the-counter Mucinex or Robitussin and use ibuprofen and/or Tylenol as needed for fever, body aches.  This should treat you like a bad cold and you should feel better within 7 to 10 days. ?- If you have any extreme weakness, chest pain, breathing difficulty, go to ER ? ? ? ? ?ED Prescriptions   ? ? Medication Sig Dispense Auth. Provider  ? molnupiravir EUA (LAGEVRIO) 200 MG CAPS capsule Take 4 capsules (800 mg total) by mouth 2 (two) times daily for 5 days. 40 capsule Shirlee Latch, PA-C  ? ?  ? ?PDMP not reviewed this encounter. ?  ?Shirlee Latch, PA-C ?03/16/21 1315 ? ?

## 2021-03-16 NOTE — Discharge Instructions (Signed)
-  Your COVID test was positive.  I sent the COVID antiviral medicine to the pharmacy.  As we discussed you need to isolate 5 days and wear a mask for 5 days.  Today is day 1. ?- Increase rest and fluids.  You can take over-the-counter Mucinex or Robitussin and use ibuprofen and/or Tylenol as needed for fever, body aches.  This should treat you like a bad cold and you should feel better within 7 to 10 days. ?- If you have any extreme weakness, chest pain, breathing difficulty, go to ER ?

## 2022-04-25 ENCOUNTER — Emergency Department: Payer: Managed Care, Other (non HMO)

## 2022-04-25 ENCOUNTER — Emergency Department
Admission: EM | Admit: 2022-04-25 | Discharge: 2022-04-25 | Disposition: A | Discharge status: Home / Self Care | Payer: Managed Care, Other (non HMO) | Attending: Emergency Medicine | Admitting: Emergency Medicine

## 2022-04-25 ENCOUNTER — Other Ambulatory Visit: Payer: Self-pay

## 2022-04-25 ENCOUNTER — Encounter: Payer: Self-pay | Admitting: Emergency Medicine

## 2022-04-25 DIAGNOSIS — I10 Essential (primary) hypertension: Secondary | ICD-10-CM | POA: Diagnosis not present

## 2022-04-25 DIAGNOSIS — R519 Headache, unspecified: Secondary | ICD-10-CM | POA: Diagnosis present

## 2022-04-25 LAB — CBC WITH DIFFERENTIAL/PLATELET
Abs Immature Granulocytes: 0.01 10*3/uL (ref 0.00–0.07)
Basophils Absolute: 0 10*3/uL (ref 0.0–0.1)
Basophils Relative: 1 %
Eosinophils Absolute: 0.1 10*3/uL (ref 0.0–0.5)
Eosinophils Relative: 2 %
HCT: 47.2 % (ref 39.0–52.0)
Hemoglobin: 16.1 g/dL (ref 13.0–17.0)
Immature Granulocytes: 0 %
Lymphocytes Relative: 38 %
Lymphs Abs: 2.5 10*3/uL (ref 0.7–4.0)
MCH: 30.3 pg (ref 26.0–34.0)
MCHC: 34.1 g/dL (ref 30.0–36.0)
MCV: 88.9 fL (ref 80.0–100.0)
Monocytes Absolute: 0.5 10*3/uL (ref 0.1–1.0)
Monocytes Relative: 7 %
Neutro Abs: 3.4 10*3/uL (ref 1.7–7.7)
Neutrophils Relative %: 52 %
Platelets: 240 10*3/uL (ref 150–400)
RBC: 5.31 MIL/uL (ref 4.22–5.81)
RDW: 13 % (ref 11.5–15.5)
WBC: 6.6 10*3/uL (ref 4.0–10.5)
nRBC: 0 % (ref 0.0–0.2)

## 2022-04-25 LAB — COMPREHENSIVE METABOLIC PANEL
ALT: 50 U/L — ABNORMAL HIGH (ref 0–44)
AST: 32 U/L (ref 15–41)
Albumin: 4.8 g/dL (ref 3.5–5.0)
Alkaline Phosphatase: 59 U/L (ref 38–126)
Anion gap: 12 (ref 5–15)
BUN: 17 mg/dL (ref 6–20)
CO2: 24 mmol/L (ref 22–32)
Calcium: 9.8 mg/dL (ref 8.9–10.3)
Chloride: 101 mmol/L (ref 98–111)
Creatinine, Ser: 0.96 mg/dL (ref 0.61–1.24)
GFR, Estimated: >60 mL/min (ref >60)
Glucose, Bld: 84 mg/dL (ref 70–99)
Potassium: 3.7 mmol/L (ref 3.5–5.1)
Sodium: 137 mmol/L (ref 135–145)
Total Bilirubin: 0.6 mg/dL (ref 0.3–1.2)
Total Protein: 8.4 g/dL — ABNORMAL HIGH (ref 6.5–8.1)

## 2022-04-25 LAB — TROPONIN I (HIGH SENSITIVITY): Troponin I (High Sensitivity): 3 ng/L (ref <18)

## 2022-04-25 NOTE — Discharge Instructions (Signed)
Please take your blood pressure medicine as prescribed for your elevated blood pressure.  Your EKG, blood work, and CT scan of your head did not show any acute changes.  Please return for any new, worsening, or change in symptoms or other concerns.  Please follow-up with your outpatient provider as you have scheduled on Wednesday.  It was a pleasure caring for you today.

## 2022-04-25 NOTE — ED Triage Notes (Signed)
Patient to ED via POV for hypertension. Patient states he as been having a headache and "feeling off" for a few days. Took BP at home and was 175/116 at home- takes BP meds but not always.

## 2022-04-25 NOTE — ED Provider Notes (Signed)
[facility] Provider Note    Event Date/Time   First MD Initiated Contact with Patient 04/25/22 1647     (approximate)   History   Hypertension   HPI  Gregory Cunningham is a 42 y.o. male with a past medical history of hypertension and trigeminal neuralgia who presents today with concerns about his blood pressure.  He reports that he has had intermittent headaches over the past few days.  He reports that he is not consistent that he can his hydrochlorothiazide.  He reports that he normally only takes it once a week if he remembers.  No nausea or vomiting.  No visual changes.  No chest pain or shortness of breath.  No abdominal pain.  He denies any associated nausea or diaphoresis.  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  There are no problems to display for this patient.         Physical Exam   Triage Vital Signs: ED Triage Vitals [04/25/22 1601]  Enc Vitals Group     BP (!) 163/105     Pulse Rate 86     Resp 18     Temp 98.2 F (36.8 C)     Temp Source Oral     SpO2 100 %     Weight      Height      Head Circumference      Peak Flow      Pain Score 7     Pain Loc      Pain Edu?      Excl. in GC?     Most recent vital signs: Vitals:   04/25/22 1601 04/25/22 1828  BP: (!) 163/105 (!) 162/98  Pulse: 86 88  Resp: 18 18  Temp: 98.2 F (36.8 C) 98.2 F (36.8 C)  SpO2: 100% 100%    Physical Exam Vitals and nursing note reviewed.  Constitutional:      General: Awake and alert. No acute distress.    Appearance: Normal appearance. The patient is normal weight.  HENT:     Head: Normocephalic and atraumatic.     Mouth: Mucous membranes are moist.  Eyes:     General: PERRL. Normal EOMs        Right eye: No discharge.        Left eye: No discharge.     Conjunctiva/sclera: Conjunctivae normal.   Cardiovascular:     Rate and Rhythm: Normal rate and regular rhythm.     Pulses: Normal pulses.     Heart sounds: Normal heart sounds Pulmonary:     Effort: Pulmonary effort is normal. No respiratory distress.     Breath sounds: Normal breath sounds.  Abdominal:     Abdomen is soft. There is no abdominal tenderness. No rebound or guarding. No distention. Musculoskeletal:        General: No swelling. Normal range of motion.     Cervical back: Normal range of motion and neck supple.  Skin:    General: Skin is warm and dry.     Capillary Refill: Capillary refill takes less than 2 seconds.     Findings: No rash.  Neurological:     Mental Status: The patient is awake and alert.  Neurological: GCS 15 alert and oriented x3 Normal speech, no expressive or receptive aphasia or dysarthria Cranial  nerves II through XII intact Normal visual fields 5 out of 5 strength in all 4 extremities with intact sensation throughout No extremity drift Normal finger-to-nose testing, no limb or truncal ataxia      ED Results / Procedures / Treatments   Labs (all labs ordered are listed, but only abnormal results are displayed) Labs Reviewed  COMPREHENSIVE METABOLIC PANEL - Abnormal; Notable for the following components:      Result Value   Total Protein 8.4 (*)    ALT 50 (*)    All other components within normal limits  CBC WITH DIFFERENTIAL/PLATELET  TROPONIN I (HIGH SENSITIVITY)  TROPONIN I (HIGH SENSITIVITY)     EKG     RADIOLOGY I independently reviewed and interpreted imaging and agree with radiologists findings.     PROCEDURES:  Critical Care performed:   Procedures   MEDICATIONS ORDERED IN ED: Medications - No data to display   IMPRESSION / MDM / ASSESSMENT AND PLAN / ED COURSE  I reviewed the triage vital signs and the nursing notes.   Differential diagnosis includes, but is not limited to, elevated blood pressure reading, intracranial abnormality, endorgan  damage, tension headache.  Patient is awake and alert, has a blood pressure of 163/105, though is normotensive and nontoxic in appearance.  He has no focal neurological deficits.  He agreed to lab work and imaging for evaluation of endorgan damage.  Labs are overall reassuring.  Normal creatinine, troponin is within normal limits.  No indication for repeat troponin given the duration of his symptoms.  CT head demonstrates no acute microvascular changes or bleeds from elevated blood pressure.  Symptoms not consistent with hypertensive emergency.  EKG is nonischemic, reviewed by the attending physician.  Patient was reevaluated multiple times, headache had improved.  We discussed the importance of taking his prescribed antihypertensives daily as they were intended.  Discussed that we will not give him any acute antihypertensive medications given that he has not "failed" his outpatient medications, and that if we bring his blood pressure down too quickly that this could also be dangerous.  Patient is in agreement with this.  He currently is asymptomatic.  He has a follow-up appointment in 2 days with his PCP, I recommended that he keep this appointment.  We discussed return precautions and importance of close outpatient follow-up.  Patient or stands and agrees with plan.  He was discharged in stable condition.   Patient's presentation is most consistent with acute complicated illness / injury requiring diagnostic workup.    FINAL CLINICAL IMPRESSION(S) / ED DIAGNOSES   Final diagnoses:  Hypertension, unspecified type     Rx / DC Orders   ED Discharge Orders     None        Note:  This document was prepared using Dragon voice recognition software and may include unintentional dictation errors.   Keturah Shavers 04/25/22 1911    Sharman Cheek, MD 04/25/22 2230

## 2023-03-16 ENCOUNTER — Ambulatory Visit
Admission: EM | Admit: 2023-03-16 | Discharge: 2023-03-16 | Disposition: A | Discharge status: Home / Self Care | Attending: Emergency Medicine | Admitting: Emergency Medicine

## 2023-03-16 ENCOUNTER — Encounter: Payer: Self-pay | Admitting: Emergency Medicine

## 2023-03-16 DIAGNOSIS — J069 Acute upper respiratory infection, unspecified: Secondary | ICD-10-CM | POA: Diagnosis present

## 2023-03-16 HISTORY — DX: Essential (primary) hypertension: I10

## 2023-03-16 LAB — RESP PANEL BY RT-PCR (FLU A&B, COVID) ARPGX2
Influenza A by PCR: NEGATIVE
Influenza B by PCR: NEGATIVE
SARS Coronavirus 2 by RT PCR: NEGATIVE

## 2023-03-16 LAB — GROUP A STREP BY PCR: Group A Strep by PCR: NOT DETECTED

## 2023-03-16 MED ORDER — IPRATROPIUM BROMIDE 0.06 % NA SOLN: 2.0000 | Freq: Four times a day (QID) | NASAL | 12 refills | Status: AC

## 2023-03-16 MED ORDER — PROMETHAZINE HCL 25 MG PO TABS
25.0000 mg | ORAL_TABLET | Freq: Four times a day (QID) | ORAL | 0 refills | Status: AC | PRN
Start: 2023-03-16 — End: ?

## 2023-03-16 MED ORDER — BENZONATATE 100 MG PO CAPS: 200.0000 mg | ORAL_CAPSULE | Freq: Three times a day (TID) | ORAL | 0 refills | Status: AC

## 2023-03-16 MED ORDER — ONDANSETRON 8 MG PO TBDP
8.0000 mg | ORAL_TABLET | Freq: Three times a day (TID) | ORAL | 0 refills | Status: AC | PRN
Start: 2023-03-16 — End: ?

## 2023-03-16 MED ORDER — PROMETHAZINE-DM 6.25-15 MG/5ML PO SYRP: 5.0000 mL | ORAL_SOLUTION | Freq: Four times a day (QID) | ORAL | 0 refills | Status: AC | PRN

## 2023-03-16 NOTE — ED Provider Notes (Signed)
 [facility]    CSN: [id number] Arrival date & time: 03/16/23  1508      History   Chief Complaint Chief Complaint  Patient presents with   Nasal Congestion   Sore Throat   Fever   Diarrhea   Emesis   Cough   Generalized Body Aches    HPI Gregory Cunningham is a 43 y.o. male.   HPI  43 year old male with past medical history significant for hypertension and trigeminal neuralgia presents for evaluation of flulike symptoms that started 2 days ago and intensified this morning.  He had a mild sore throat nasal congestion 2 days ago and then woke up this morning with fever up to 102, body aches, productive cough, shortness of breath, nausea, vomiting, diarrhea.  He denies any wheezing.  Past Medical History:  Diagnosis Date   Hypertension    Trigeminal neuralgia     There are no active problems to display for this patient.   Past Surgical History:  Procedure Laterality Date   CRANIOTOMY     TONSILLECTOMY         Home Medications    Prior to Admission medications   Medication Sig Start Date End Date Taking? Authorizing Provider  benzonatate (TESSALON) 100 MG capsule Take 2 capsules (200 mg total) by mouth every 8 (eight) hours. 03/16/23  Yes Becky Augusta, NP  hydrochlorothiazide (HYDRODIURIL) 25 MG tablet Take 25 mg by mouth daily. 03/02/21  Yes [provider]  ipratropium (ATROVENT) 0.06 % nasal spray Place 2 sprays into both nostrils 4 (four) times daily. 03/16/23  Yes Becky Augusta, NP  ondansetron (ZOFRAN-ODT) 8 MG disintegrating tablet Take 1 tablet (8 mg total) by mouth every 8 (eight) hours as needed for nausea or vomiting. 03/16/23  Yes Becky Augusta, NP  OXcarbazepine (TRILEPTAL) 150 MG tablet Patient takes 450 mg three times daily and 600 mg once daily 01/06/20  Yes [provider]  promethazine (PHENERGAN) 25 MG tablet Take 1 tablet (25 mg total) by mouth every 6 (six) hours as needed for nausea or vomiting. 03/16/23  Yes Becky Augusta, NP   promethazine-dextromethorphan (PROMETHAZINE-DM) 6.25-15 MG/5ML syrup Take 5 mLs by mouth 4 (four) times daily as needed. 03/16/23  Yes Becky Augusta, NP    Family History Family History  Problem Relation Age of Onset   Hypertension Mother    Kidney Stones Mother    CAD Father    COPD Father     Social History Social History   Tobacco Use   Smoking status: Former    Current packs/day: 0.00    Types: Cigarettes    Quit date: 12/25/2008    Years since quitting: 14.2   Smokeless tobacco: Never  Vaping Use   Vaping status: Never Used  Substance Use Topics   Alcohol use: No   Drug use: No     Allergies   Gluten meal and Sudafed [pseudoephedrine]   Review of Systems Review of Systems  Constitutional:  Positive for diaphoresis and fever.  HENT:  Positive for congestion, rhinorrhea and sore throat. Negative for ear pain.   Respiratory:  Positive for cough and shortness of breath. Negative for wheezing.   Gastrointestinal:  Positive for diarrhea, nausea and vomiting.  Musculoskeletal:  Positive for arthralgias and myalgias.  Skin:  Negative for rash.  Neurological:  Positive for headaches.     Physical Exam Triage Vital Signs ED Triage Vitals  Encounter Vitals Group     BP      Systolic BP  Percentile      Diastolic BP Percentile      Pulse      Resp      Temp      Temp src      SpO2      Weight      Height      Head Circumference      Peak Flow      Pain Score      Pain Loc      Pain Education      Exclude from Growth Chart    No data found.  Updated Vital Signs BP 105/65 (BP Location: Left Arm)   Pulse (!) 130   Temp 98.8 F (37.1 C)   Resp 18   SpO2 99%   Visual Acuity Right Eye Distance:   Left Eye Distance:   Bilateral Distance:    Right Eye Near:   Left Eye Near:    Bilateral Near:     Physical Exam Vitals and nursing note reviewed.  Constitutional:      Appearance: Normal appearance. He is ill-appearing.  HENT:     Head:  Normocephalic and atraumatic.     Right Ear: Tympanic membrane, ear canal and external ear normal. There is no impacted cerumen.     Left Ear: Tympanic membrane, ear canal and external ear normal. There is no impacted cerumen.     Nose: Congestion and rhinorrhea present.     Comments: This is edematous erythematous with clear discharge in both nares.    Mouth/Throat:     Mouth: Mucous membranes are moist.     Pharynx: Oropharynx is clear. Posterior oropharyngeal erythema present. No oropharyngeal exudate.     Comments: To the soft palate bilateral tonsillar pillars.  No appreciable exudate. Cardiovascular:     Rate and Rhythm: Normal rate and regular rhythm.     Pulses: Normal pulses.     Heart sounds: Normal heart sounds. No murmur heard.    No friction rub. No gallop.  Pulmonary:     Effort: Pulmonary effort is normal.     Breath sounds: Normal breath sounds. No wheezing, rhonchi or rales.  Musculoskeletal:     Cervical back: Normal range of motion and neck supple. No tenderness.  Skin:    General: Skin is warm.     Capillary Refill: Capillary refill takes less than 2 seconds.     Coloration: Skin is pale.  Neurological:     General: No focal deficit present.     Mental Status: He is alert and oriented to person, place, and time.      UC Treatments / Results  Labs (all labs ordered are listed, but only abnormal results are displayed) Labs Reviewed  RESP PANEL BY RT-PCR (FLU A&B, COVID) [id number]  GROUP A STREP BY PCR    EKG   Radiology No results found.  Procedures Procedures (including critical care time)  Medications Ordered in UC Medications - No data to display  Initial Impression / Assessment and Plan / UC Course  I have reviewed the triage vital signs and the nursing notes.  Pertinent labs & imaging results that were available during my care of the patient were reviewed by me and considered in my medical decision making (see chart for details).   Patient is  a pleasant, though ill-appearing, 43 year old male presenting for evaluation of flulike symptoms outlined HPI above.  In the exam room the patient is pale and clammy.  He is able to speak  in full sentence without dyspnea or tachypnea and he states that he is experiencing some upset stomach but no overt nausea at this time.  He took 10 mg of Zofran 3 hours prior to arrival.  He had some Zofran that was leftover from his son's illness so he took 2 teaspoons.  His physical exam does reveal inflammation of his upper respiratory tract as evidenced by inflamed nasal mucosa with clear rhinorrhea.  Also edematous and erythematous tonsillar pillars without exudate.  No cervical lymphadenopathy appreciated.  Cardiopulmonary exam reveals clear lung sounds all fields.  Differential diagnose include COVID, influenza, strep, viral illness.  I will order a strep PCR and a COVID and flu PCR.  I advised the patient that if his nausea starts to increase we can give him a dose of Reglan given that he is already had Zofran.  Strep PCR is negative.  Respiratory panel is negative for COVID and influenza.  I will discharge patient home with a diagnosis of viral URI with a cough.  I will prescribe Zofran that he can use every 8 hours as needed for nausea and vomiting.  I will also prescribe some Phenergan tablets that he can use if the Zofran is not working.  Also prescribed Atrovent Nasabid up nasal congestion.  Tessalon Perles and Promethazine DM cough syrup for cough and congestion.   Final Clinical Impressions(s) / UC Diagnoses   Final diagnoses:  Viral URI with cough     Discharge Instructions      Your testing today was negative for COVID, influenza, or strep.  I do believe you have a viral respiratory illness which is causing your symptoms.  Use over-the-counter Tylenol and/or ibuprofen according the package instructions as needed for any fever or pain.  Use the Zofran 8 mg every 8 hours as needed for nausea  and vomiting.  If the Zofran is not controlling your nausea you may use 25 mg of Phenergan every 6 hours.  Be mindful the Phenergan will make you drowsy.  Use the Atrovent nasal spray, 2 squirts in each nostril every 6 hours, as needed for runny nose and postnasal drip.  Use the Tessalon Perles every 8 hours during the day.  Take them with a small sip of water.  They may give you some numbness to the base of your tongue or a metallic taste in your mouth, this is normal.  Use the Promethazine DM cough syrup at bedtime for cough and congestion.  It will make you drowsy so do not take it during the day.  Return for reevaluation or see your primary care provider for any new or worsening symptoms.      ED Prescriptions     Medication Sig Dispense Auth. Provider   benzonatate (TESSALON) 100 MG capsule Take 2 capsules (200 mg total) by mouth every 8 (eight) hours. 21 capsule Becky Augusta, NP   ipratropium (ATROVENT) 0.06 % nasal spray Place 2 sprays into both nostrils 4 (four) times daily. 15 mL Becky Augusta, NP   promethazine-dextromethorphan (PROMETHAZINE-DM) 6.25-15 MG/5ML syrup Take 5 mLs by mouth 4 (four) times daily as needed. 118 mL Becky Augusta, NP   ondansetron (ZOFRAN-ODT) 8 MG disintegrating tablet Take 1 tablet (8 mg total) by mouth every 8 (eight) hours as needed for nausea or vomiting. 20 tablet Becky Augusta, NP   promethazine (PHENERGAN) 25 MG tablet Take 1 tablet (25 mg total) by mouth every 6 (six) hours as needed for nausea or vomiting. 30 tablet Becky Augusta, NP  PDMP not reviewed this encounter.   Becky Augusta, NP 03/16/23 [phone]

## 2023-03-16 NOTE — ED Triage Notes (Signed)
 Pt presents with a sore throat and nasal congestion x 3 days. This morning he woke up with vomiting, diarrhea, bodyaches, cough and fever that started today.

## 2023-03-16 NOTE — Discharge Instructions (Signed)
 Your testing today was negative for COVID, influenza, or strep.  I do believe you have a viral respiratory illness which is causing your symptoms.  Use over-the-counter Tylenol and/or ibuprofen according the package instructions as needed for any fever or pain.  Use the Zofran 8 mg every 8 hours as needed for nausea and vomiting.  If the Zofran is not controlling your nausea you may use 25 mg of Phenergan every 6 hours.  Be mindful the Phenergan will make you drowsy.  Use the Atrovent nasal spray, 2 squirts in each nostril every 6 hours, as needed for runny nose and postnasal drip.  Use the Tessalon Perles every 8 hours during the day.  Take them with a small sip of water.  They may give you some numbness to the base of your tongue or a metallic taste in your mouth, this is normal.  Use the Promethazine DM cough syrup at bedtime for cough and congestion.  It will make you drowsy so do not take it during the day.  Return for reevaluation or see your primary care provider for any new or worsening symptoms.
